# Patient Record
Sex: Male | Born: 1960 | Hispanic: No | Marital: Single | Smoking: Never smoker
Health system: Southern US, Community
[De-identification: ages and names within clinical notes are randomized; demographics above are authoritative.]

---

## 1997-04-13 ENCOUNTER — Ambulatory Visit (HOSPITAL_COMMUNITY): Admission: RE | Admit: 1997-04-13 | Discharge: 1997-04-13 | Payer: Self-pay | Admitting: *Deleted

## 2000-01-08 ENCOUNTER — Ambulatory Visit (HOSPITAL_COMMUNITY): Admission: RE | Admit: 2000-01-08 | Discharge: 2000-01-08 | Payer: Self-pay

## 2000-07-29 ENCOUNTER — Encounter: Payer: Self-pay | Admitting: Emergency Medicine

## 2000-07-29 ENCOUNTER — Emergency Department (HOSPITAL_COMMUNITY): Admission: EM | Admit: 2000-07-29 | Discharge: 2000-07-29 | Payer: Self-pay

## 2002-03-07 ENCOUNTER — Emergency Department (HOSPITAL_COMMUNITY): Admission: EM | Admit: 2002-03-07 | Discharge: 2002-03-08 | Payer: Self-pay | Admitting: Emergency Medicine

## 2002-03-08 ENCOUNTER — Encounter: Payer: Self-pay | Admitting: Emergency Medicine

## 2005-08-11 ENCOUNTER — Ambulatory Visit (HOSPITAL_COMMUNITY): Admission: RE | Admit: 2005-08-11 | Discharge: 2005-08-11 | Payer: Self-pay | Admitting: Emergency Medicine

## 2007-06-09 IMAGING — RF DG BE W/ CM - WO/W KUB
17 of 19 series · 17 of 19 positions shown · non-contrast
Comparison: none

CLINICAL DATA: Abdominal pain. Constipation. 
 BARIUM ENEMA:

[Series 1: run · 1 of 1 slices shown (1 of 15)]
[im 1/1]
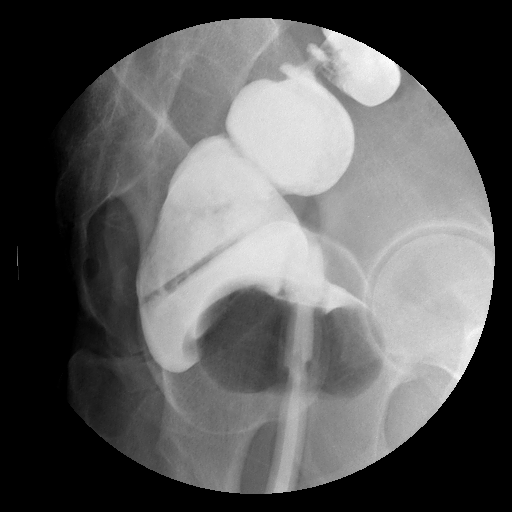

[Series 2: run · 1 of 1 slices shown (2 of 15)]
[im 1/1]
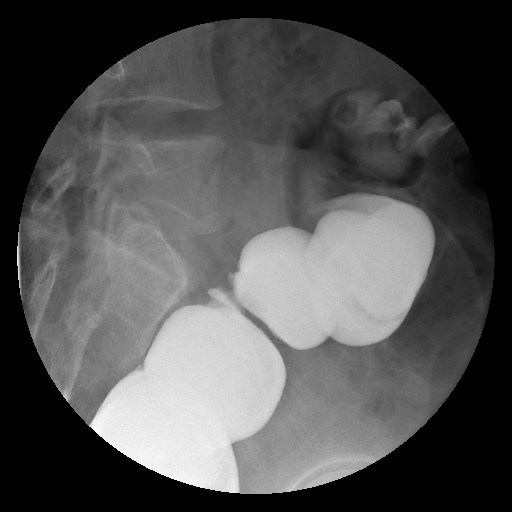

[Series 3: run · 1 of 1 slices shown (3 of 15)]
[im 1/1]
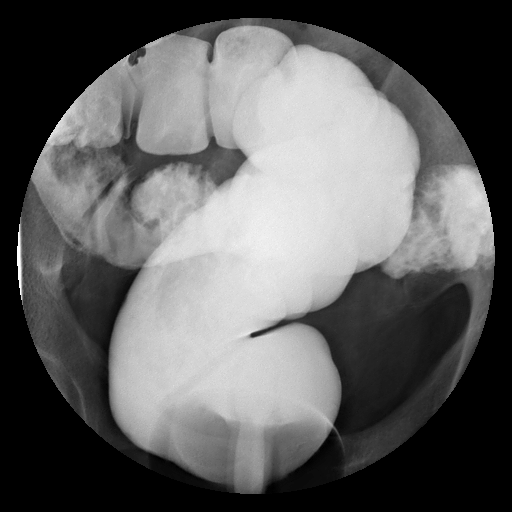

[Series 4: run · 1 of 1 slices shown (4 of 15)]
[im 1/1]
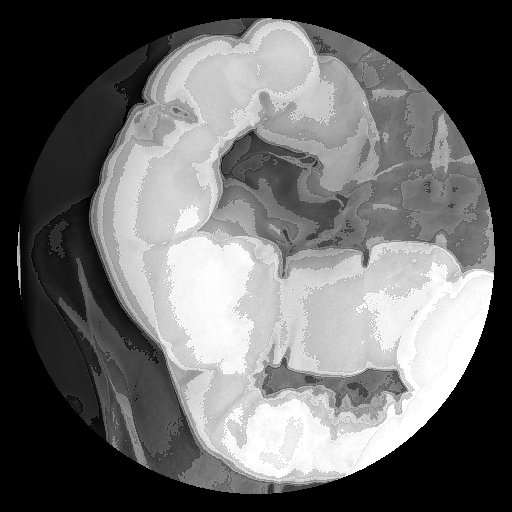

[Series 6: run · 1 of 1 slices shown (5 of 15)]
[im 1/1]
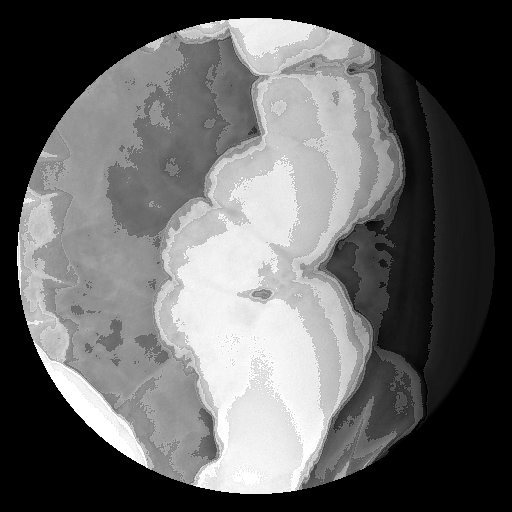

[Series 7: run · 1 of 1 slices shown (6 of 15)]
[im 1/1]
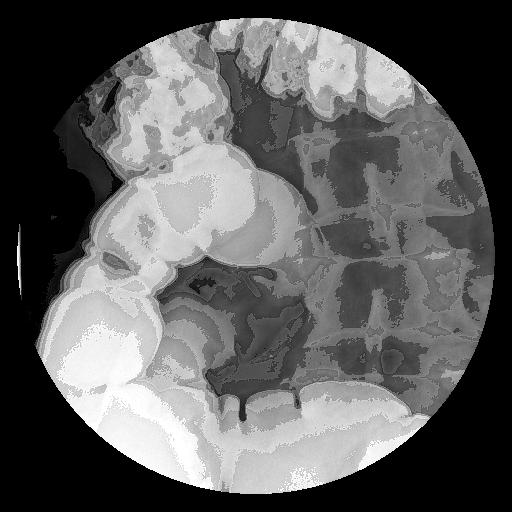

[Series 8: run · 1 of 1 slices shown (7 of 15)]
[im 1/1]
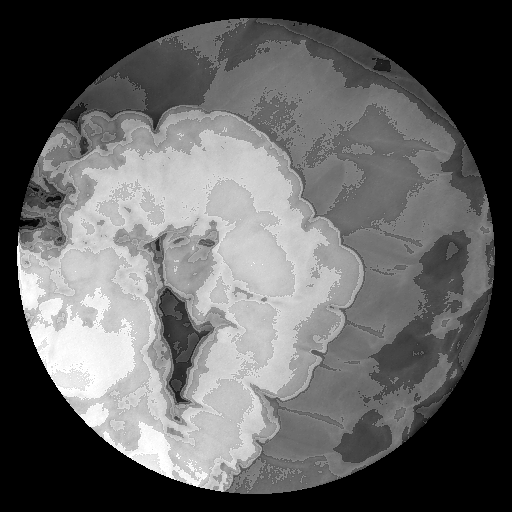

[Series 9: run · 1 of 1 slices shown (8 of 15)]
[im 1/1]
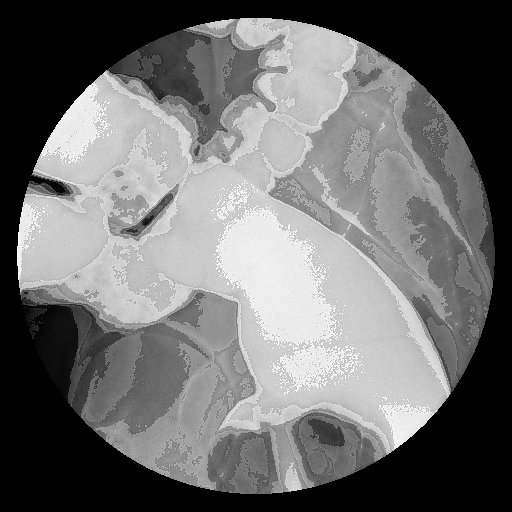

[Series 10: run · 1 of 1 slices shown (9 of 15)]
[im 1/1]
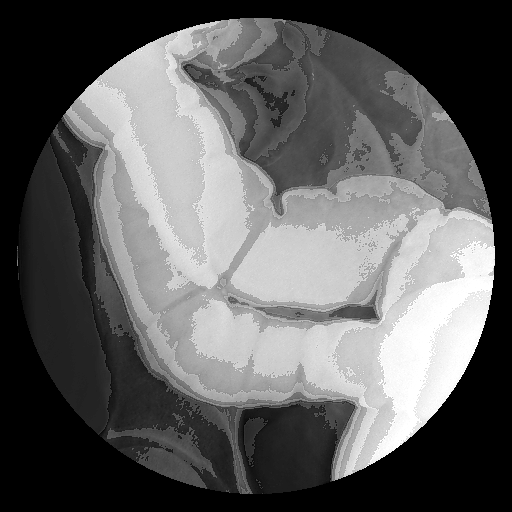

[Series 11: run · 1 of 1 slices shown (10 of 15)]
[im 1/1]
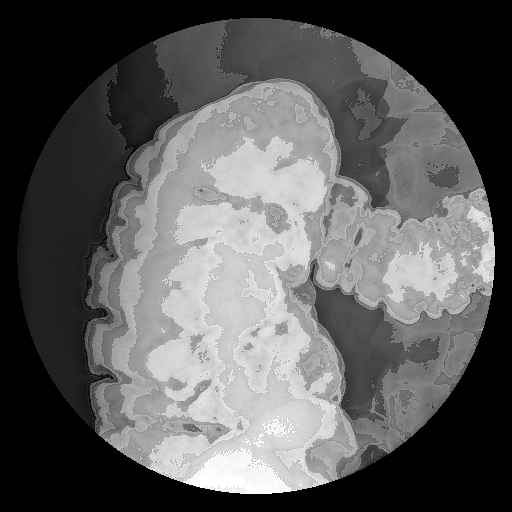

[Series 12: run · 1 of 1 slices shown (11 of 15)]
[im 1/1]
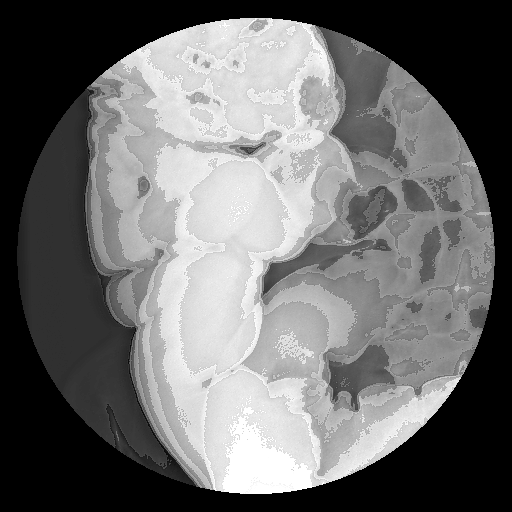

[Series 13: run · 1 of 1 slices shown (12 of 15)]
[im 1/1]
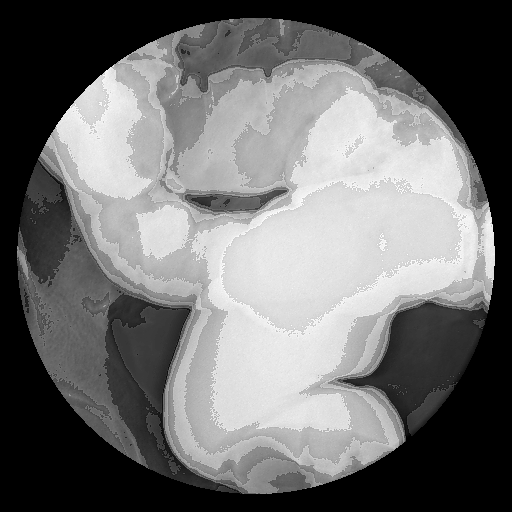

[Series 14: run · 1 of 1 slices shown (13 of 15)]
[im 1/1]
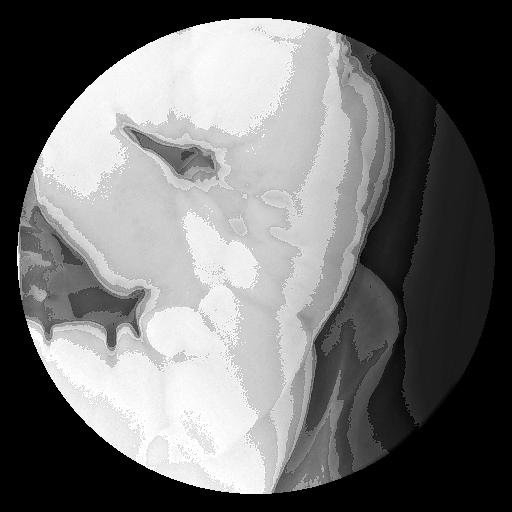

[Series 16: run · 1 of 1 slices shown (14 of 15)]
[im 1/1]
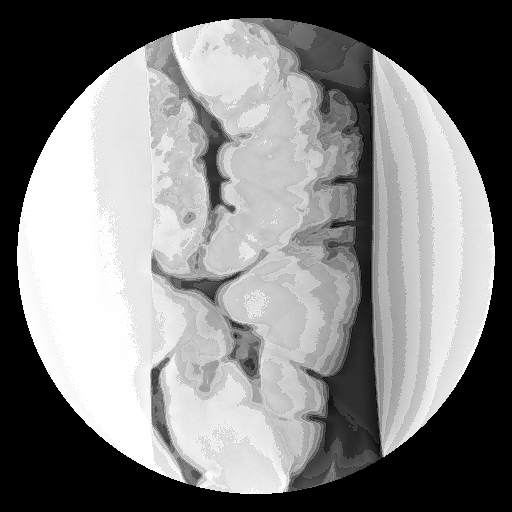

[Series 17: run · 1 of 1 slices shown (15 of 15)]
[im 1/1]
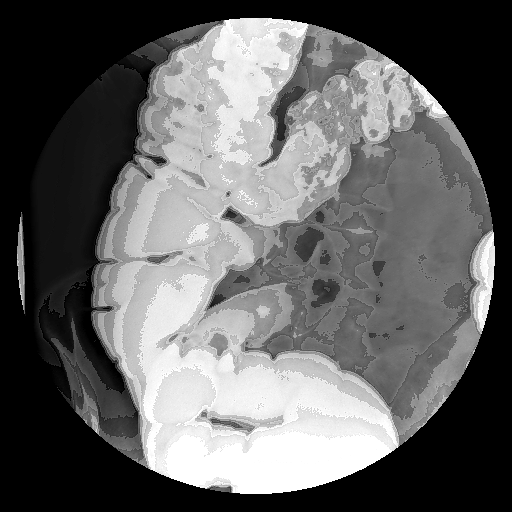

[Series 1001: view not recorded · 0.20mm/px · 1 of 1 slices shown (1 of 2)]
[im 1/1]
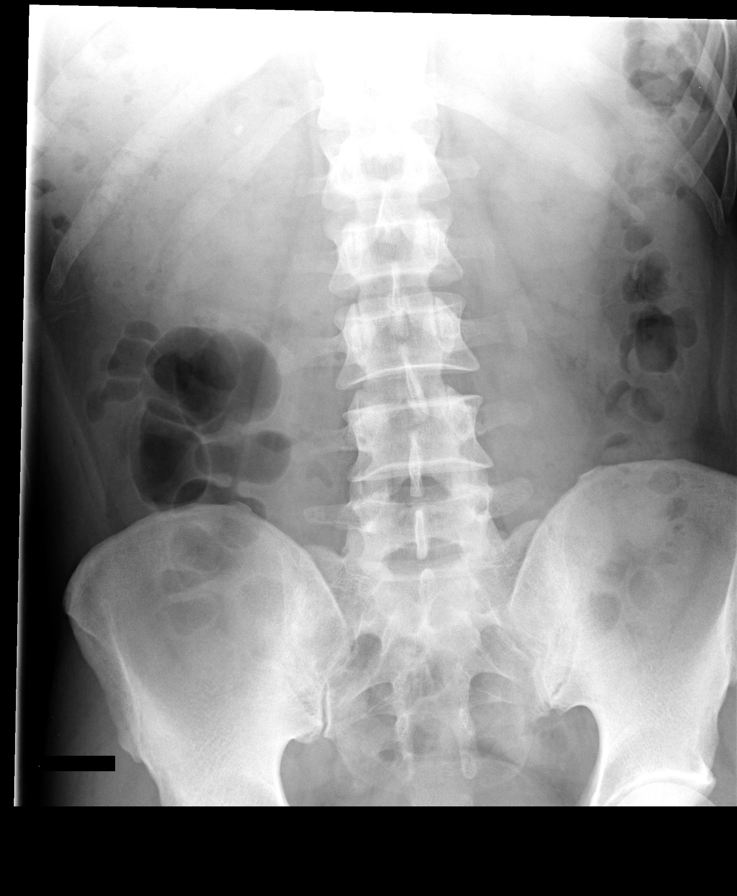

[Series 1002: view not recorded · 0.20mm/px · 1 of 1 slices shown (2 of 2)]
[im 1/1]
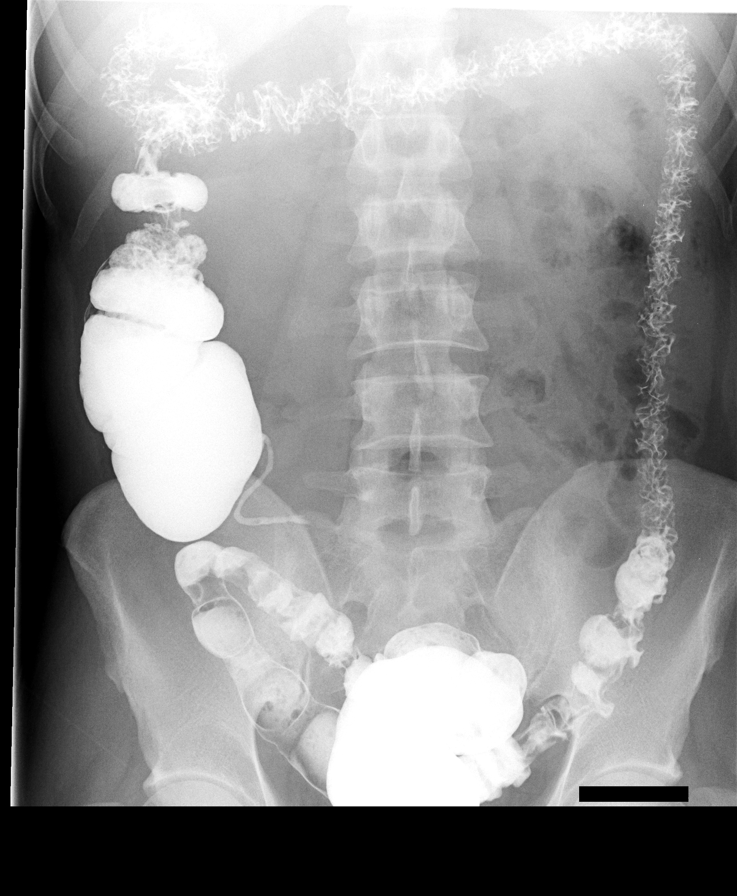

[17 of 19 positions shown; findings below may reference images not displayed]

FINDINGS: Preliminary scout film reveals no evidence of colonic distention. Particularly descending colon has a normal caliber. Approximately 5.5 x 6.8 mm calculus in the upper pole of the right kidney.  Barium was instilled in a retrograde fashion to opacify the entire colon.  The colon is somewhat tortuous and there is scattered stool, but no evidence of a colonic stricture or other obstructing lesion. This was an unprepped BE.
IMPRESSION: Specifically, the study is negative for colonic obstruction.

## 2011-05-12 ENCOUNTER — Ambulatory Visit: Payer: Self-pay | Admitting: Family Medicine

## 2011-05-12 VITALS — BP 126/74 | HR 79 | Temp 97.9°F | Resp 18 | Ht 69.0 in | Wt 163.0 lb

## 2011-05-12 DIAGNOSIS — I10 Essential (primary) hypertension: Secondary | ICD-10-CM

## 2011-05-12 LAB — COMPREHENSIVE METABOLIC PANEL
ALT: 19 U/L (ref 0–53)
AST: 20 U/L (ref 0–37)
Albumin: 4.3 g/dL (ref 3.5–5.2)
Alkaline Phosphatase: 62 U/L (ref 39–117)
BUN: 18 mg/dL (ref 6–23)
CO2: 25 mEq/L (ref 19–32)
Calcium: 9.2 mg/dL (ref 8.4–10.5)
Chloride: 107 mEq/L (ref 96–112)
Creat: 0.79 mg/dL (ref 0.50–1.35)
Glucose, Bld: 100 mg/dL — ABNORMAL HIGH (ref 70–99)
Potassium: 4 mEq/L (ref 3.5–5.3)
Sodium: 140 mEq/L (ref 135–145)
Total Bilirubin: 0.7 mg/dL (ref 0.3–1.2)
Total Protein: 6.6 g/dL (ref 6.0–8.3)

## 2011-05-12 MED ORDER — LISINOPRIL-HYDROCHLOROTHIAZIDE 10-12.5 MG PO TABS: 1.0000 | ORAL_TABLET | Freq: Every day | ORAL | Status: DC

## 2011-05-12 NOTE — Patient Instructions (Signed)
Your labs should return in the next 7 to 10 days.  Call us if you do not receive a call or letter with the results in the next 10 days.  Schedule physical in the next 6 months.

## 2011-05-12 NOTE — Progress Notes (Signed)
  Subjective:    Patient ID: Keith Blankenship, male    DOB: [birth year], 51 y.o.   MRN: [id number]  HPI [name] is a 51 y.o. male Taking BP med daily,  Has 4 left, no missed doses. No side effects with meds.  Lives in [place].  Last BMP 8/12 WNL except glucose 104.  Fasting this am.    Review of Systems  Constitutional: Negative for fatigue and unexpected weight change.  Eyes: Negative for visual disturbance.  Respiratory: Negative for cough, chest tightness and shortness of breath.   Cardiovascular: Negative for chest pain, palpitations and leg swelling.  Gastrointestinal: Negative for abdominal pain and blood in stool.  Neurological: Negative for dizziness, light-headedness and headaches.        Objective:   Physical Exam  Constitutional: He is oriented to person, place, and time. He appears well-developed and well-nourished.  HENT:  Head: Normocephalic and atraumatic.  Eyes: EOM are normal. Pupils are equal, round, and reactive to light.  Neck: No JVD present. Carotid bruit is not present.  Cardiovascular: Normal rate, regular rhythm and normal heart sounds.   No murmur heard. Pulmonary/Chest: Effort normal and breath sounds normal. He has no rales.  Musculoskeletal: He exhibits no edema.  Neurological: He is alert and oriented to person, place, and time.  Skin: Skin is warm and dry.  Psychiatric: He has a normal mood and affect.          Assessment & Plan:   1. HTN (hypertension) - controlled. Check Comprehensive metabolic panel, lipids, cont same doses meds.    Plan on CPE next 6 months - pt to call to schedule next 6 months.

## 2011-11-16 ENCOUNTER — Ambulatory Visit: Payer: Self-pay | Admitting: Emergency Medicine

## 2011-11-16 VITALS — BP 123/75 | HR 81 | Temp 98.5°F | Resp 18 | Ht 69.0 in | Wt 158.0 lb

## 2011-11-16 DIAGNOSIS — I10 Essential (primary) hypertension: Secondary | ICD-10-CM

## 2011-11-16 LAB — BASIC METABOLIC PANEL
BUN: 18 mg/dL (ref 6–23)
CO2: 30 mEq/L (ref 19–32)
Glucose, Bld: 104 mg/dL — ABNORMAL HIGH (ref 70–99)
Potassium: 4.4 mEq/L (ref 3.5–5.3)
Sodium: 140 mEq/L (ref 135–145)

## 2011-11-16 LAB — LIPID PANEL
Cholesterol: 164 mg/dL (ref 0–200)
Total CHOL/HDL Ratio: 4.6 Ratio
VLDL: 26 mg/dL (ref 0–40)

## 2011-11-16 MED ORDER — LISINOPRIL-HYDROCHLOROTHIAZIDE 10-12.5 MG PO TABS: 1.0000 | ORAL_TABLET | Freq: Every day | ORAL | Status: DC

## 2011-11-16 NOTE — Progress Notes (Signed)
  Subjective:    Patient ID: Keith Blankenship, male    DOB: 1961-01-23, 51 y.o.   MRN: [id number]  HPI here to followup on his high blood pressure. He has been taking his medication regular. He eats a healthy diet he. He is physically active with no other major medical problems.    Review of Systems     Objective:   Physical Exam chest is clear. Heart regular rate no murmurs. Abdomen soft nontender. Extremities without edema.        Assessment & Plan:  We'll check be met and lipid panel refilled medicine #90 with 3 refills

## 2011-11-16 NOTE — Patient Instructions (Addendum)

## 2012-06-13 ENCOUNTER — Ambulatory Visit: Payer: Self-pay | Admitting: Family Medicine

## 2012-06-13 VITALS — BP 124/64 | HR 91 | Temp 97.7°F | Resp 16 | Ht 69.0 in | Wt 166.0 lb

## 2012-06-13 DIAGNOSIS — I10 Essential (primary) hypertension: Secondary | ICD-10-CM | POA: Insufficient documentation

## 2012-06-13 DIAGNOSIS — Z79899 Other long term (current) drug therapy: Secondary | ICD-10-CM

## 2012-06-13 LAB — BASIC METABOLIC PANEL
Chloride: 100 mEq/L (ref 96–112)
Potassium: 4.2 mEq/L (ref 3.5–5.3)
Sodium: 135 mEq/L (ref 135–145)

## 2012-06-13 MED ORDER — LISINOPRIL-HYDROCHLOROTHIAZIDE 10-12.5 MG PO TABS: 1.0000 | ORAL_TABLET | Freq: Every day | ORAL | Status: DC

## 2012-06-13 NOTE — Progress Notes (Signed)
  Subjective:    Patient ID: Keith Blankenship, male    DOB: [birth year], 52 y.o.   MRN: [id number] Chief Complaint  Patient presents with  . Medication Refill    HPI   Mr. Seeley is doing well.  He has no complaints or concerns. He is in town visiting his brother so has come in for a refill on her BP medication.  His neighbor who works at the hospital has been checking his BP once a wk and it is always normal.  He is having no med side effects. No sig exercise but work on diet - only eats meat once a wk, lots of veggies, fish, olive oil  History reviewed. No pertinent past medical history. Current Outpatient Prescriptions on File Prior to Visit  Medication Sig Dispense Refill  . aspirin 81 MG tablet Take 81 mg by mouth as needed.       No current facility-administered medications on file prior to visit.   No Known Allergies   Review of Systems  Constitutional: Negative for fever and chills.  Eyes: Negative for visual disturbance.  Respiratory: Negative for shortness of breath.   Cardiovascular: Negative for chest pain and leg swelling.  Neurological: Negative for dizziness, syncope, facial asymmetry, weakness, light-headedness and headaches.      BP 124/64  Pulse 91  Temp(Src) 97.7 F (36.5 C) (Oral)  Resp 16  Ht 5\' 9"  (1.753 m)  Wt 166 lb (75.297 kg)  BMI 24.5 kg/m2  SpO2 98% Objective:   Physical Exam  Constitutional: He is oriented to person, place, and time. He appears well-developed and well-nourished. No distress.  HENT:  Head: Normocephalic and atraumatic.  Eyes: Conjunctivae are normal. Pupils are equal, round, and reactive to light. No scleral icterus.  Neck: Normal range of motion. Neck supple. No thyromegaly present.  Cardiovascular: Normal rate, regular rhythm, normal heart sounds and intact distal pulses.   Pulmonary/Chest: Effort normal and breath sounds normal. No respiratory distress.  Musculoskeletal: He exhibits no edema.  Lymphadenopathy:    He has no  cervical adenopathy.  Neurological: He is alert and oriented to person, place, and time.  Skin: Skin is warm and dry. He is not diaphoretic.  Psychiatric: He has a normal mood and affect. His behavior is normal.      Assessment & Plan:  Encounter for long-term (current) use of other medications - Plan: Basic metabolic panel  Essential hypertension, benign  Hypertension - Plan: lisinopril-hydrochlorothiazide (PRINZIDE,ZESTORETIC) 10-12.5 MG per tablet  Lipids checked last visit - at goal.  Recheck in 1 yr.

## 2013-06-24 ENCOUNTER — Telehealth: Payer: Self-pay

## 2013-06-24 NOTE — Telephone Encounter (Addendum)
PT STATES HE NOW LIVE IN [place] NOW AND IS OUT OF HIS LISINOPRIL 10-12.5MG S. WOULD LIKE TO HAVE ANOTHER 2 MONTHS SUPPLY CALLED IN FOR HIM PLEASE CALL PT AT [phone]    [facility] [place] AT [phone]

## 2013-06-24 NOTE — Telephone Encounter (Signed)
Advised pt that he would need to establish care in his area. He has not been seen in our office in over a year.

## 2017-10-28 LAB — LIPID PANEL
Cholesterol: 2 (ref 0–200)
HDL: 0 — AB (ref 35–70)
LDL Cholesterol: 1
Triglycerides: 3 — AB (ref 40–160)

## 2017-10-28 LAB — BASIC METABOLIC PANEL
Creatinine: 7.4 — AB (ref 0.6–1.3)
Glucose: 176

## 2018-01-14 ENCOUNTER — Encounter: Payer: Self-pay | Admitting: Family Medicine

## 2018-01-14 ENCOUNTER — Ambulatory Visit: Payer: Self-pay | Admitting: Family Medicine

## 2018-01-14 ENCOUNTER — Other Ambulatory Visit: Payer: Self-pay

## 2018-01-14 VITALS — BP 172/100 | HR 92 | Temp 97.9°F | Resp 16 | Ht 69.0 in | Wt 192.2 lb

## 2018-01-14 DIAGNOSIS — I10 Essential (primary) hypertension: Secondary | ICD-10-CM

## 2018-01-14 DIAGNOSIS — Z5181 Encounter for therapeutic drug level monitoring: Secondary | ICD-10-CM

## 2018-01-14 DIAGNOSIS — E781 Pure hyperglyceridemia: Secondary | ICD-10-CM

## 2018-01-14 MED ORDER — LISINOPRIL-HYDROCHLOROTHIAZIDE 10-12.5 MG PO TABS: 1.0000 | ORAL_TABLET | Freq: Every day | ORAL | 0 refills | Status: DC

## 2018-01-14 NOTE — Progress Notes (Signed)
Chief Complaint  Patient presents with  . Medication Refill    lisinopril-hctz 20mg /12.5 mg    HPI  Uncontrolled Hypertension and Hypertriglyceridemia Patient reports that he was seen in hypertension while in [place] in [place] He had lab done and his creatinine was in the normal range  He had low HDL and high triglycerides He states that he eats fish He is working on weight loss to correct his cholesterol and triglycerides No family history of heart attack or stroke He has never smoked cigarettes His last dose was 3 days ago   History reviewed. No pertinent past medical history.  Current Outpatient Medications  Medication Sig Dispense Refill  . aspirin 81 MG tablet Take 81 mg by mouth as needed.    Marland Kitchen lisinopril-hydrochlorothiazide (PRINZIDE,ZESTORETIC) 10-12.5 MG tablet Take 1 tablet by mouth daily. 90 tablet 0   No current facility-administered medications for this visit.     Allergies: No Known Allergies  History reviewed. No pertinent surgical history.  Social History   Socioeconomic History  . Marital status: Single    Spouse name: Not on file  . Number of children: Not on file  . Years of education: Not on file  . Highest education level: Not on file  Occupational History  . Occupation: Environmental manager  Social Needs  . Financial resource strain: Not on file  . Food insecurity:    Worry: Not on file    Inability: Not on file  . Transportation needs:    Medical: Not on file    Non-medical: Not on file  Tobacco Use  . Smoking status: Never Smoker  . Smokeless tobacco: Never Used  Substance and Sexual Activity  . Alcohol use: No  . Drug use: No  . Sexual activity: Not on file  Lifestyle  . Physical activity:    Days per week: Not on file    Minutes per session: Not on file  . Stress: Not on file  Relationships  . Social connections:    Talks on phone: Not on file    Gets together: Not on file    Attends religious service: Not on file    Active  member of club or organization: Not on file    Attends meetings of clubs or organizations: Not on file    Relationship status: Not on file  Other Topics Concern  . Not on file  Social History Narrative  . Not on file    History reviewed. No pertinent family history.   ROS Review of Systems See HPI Constitution: No fevers or chills No malaise No diaphoresis Skin: No rash or itching Eyes: no blurry vision, no double vision GU: no dysuria or hematuria Neuro: no dizziness or headaches * all others reviewed and negative   Objective: Vitals:   01/14/18 0829  BP: (!) 172/100  Pulse: 92  Resp: 16  Temp: 97.9 F (36.6 C)  TempSrc: Oral  SpO2: 93%  Weight: 192 lb 3.2 oz (87.2 kg)  Height: 5\' 9"  (1.753 m)    Physical Exam  Constitutional: He is oriented to person, place, and time. He appears well-developed and well-nourished.  HENT:  Head: Normocephalic and atraumatic.  Eyes: Pupils are equal, round, and reactive to light. Conjunctivae and EOM are normal.  Normal fundoscopic exam   Neck: Normal range of motion. Neck supple.  Cardiovascular: Normal rate, regular rhythm and normal heart sounds.  No murmur heard. Pulmonary/Chest: Effort normal and breath sounds normal. No stridor. No respiratory distress. He has  no wheezes.  Neurological: He is alert and oriented to person, place, and time.  Skin: Skin is warm. Capillary refill takes less than 2 seconds.  Psychiatric: He has a normal mood and affect. His behavior is normal. Judgment and thought content normal.     Assessment and Plan Geddy was seen today for medication refill.  Diagnoses and all orders for this visit:  Hypertriglyceridemia- discussed fish oil and ways to improve HDL -     Basic Metabolic Panel -     Lipid panel  Encounter for medication monitoring- will check renal  -     Basic Metabolic Panel -     Lipid panel  Uncontrolled hypertension- discussed goals for bp Pt to return in 6 weeks after  resuming meds If his bp is normal at that time will give a year's refills since he is a traveler -     lisinopril-hydrochlorothiazide (PRINZIDE,ZESTORETIC) 10-12.5 MG tablet; Take 1 tablet by mouth daily.     Keith Blankenship A Zakya Halabi

## 2018-01-14 NOTE — Patient Instructions (Addendum)
   If you have lab work done today you will be contacted with your lab results within the next 2 weeks.  If you have not heard from us then please contact us. The fastest way to get your results is to register for My Chart.   IF you received an x-ray today, you will receive an invoice from [facility]. Please contact [facility] at [phone] with questions or concerns regarding your invoice.   IF you received labwork today, you will receive an invoice from [facility]. Please contact [facility] at [phone] with questions or concerns regarding your invoice.   Our billing staff will not be able to assist you with questions regarding bills from these companies.  You will be contacted with the lab results as soon as they are available. The fastest way to get your results is to activate your My Chart account. Instructions are located on the last page of this paperwork. If you have not heard from us regarding the results in 2 weeks, please contact this office.     Food Choices to Lower Your Triglycerides Triglycerides are a type of fat in your blood. High levels of triglycerides can increase the risk of heart disease and stroke. If your triglyceride levels are high, the foods you eat and your eating habits are very important. Choosing the right foods can help lower your triglycerides. What general guidelines do I need to follow?  Lose weight if you are overweight.  Limit or avoid alcohol.  Fill one half of your plate with vegetables and green salads.  Limit fruit to two servings a day. Choose fruit instead of juice.  Make one fourth of your plate whole grains. Look for the word "whole" as the first word in the ingredient list.  Fill one fourth of your plate with lean protein foods.  Enjoy fatty fish (such as salmon, mackerel, sardines, and tuna) three times a week.  Choose healthy fats.  Limit foods high in starch and sugar.  Eat more home-cooked food and less  restaurant, buffet, and fast food.  Limit fried foods.  Cook foods using methods other than frying.  Limit saturated fats.  Check ingredient lists to avoid foods with partially hydrogenated oils (trans fats) in them. What foods can I eat? Grains Whole grains, such as whole wheat or whole grain breads, crackers, cereals, and pasta. Unsweetened oatmeal, bulgur, barley, quinoa, or brown rice. Corn or whole wheat flour tortillas. Vegetables Fresh or frozen vegetables (raw, steamed, roasted, or grilled). Green salads. Fruits All fresh, canned (in natural juice), or frozen fruits. Meat and Other Protein Products Ground beef (85% or leaner), grass-fed beef, or beef trimmed of fat. Skinless chicken or turkey. Ground chicken or turkey. Pork trimmed of fat. All fish and seafood. Eggs. Dried beans, peas, or lentils. Unsalted nuts or seeds. Unsalted canned or dry beans. Dairy Low-fat dairy products, such as skim or 1% milk, 2% or reduced-fat cheeses, low-fat ricotta or cottage cheese, or plain low-fat yogurt. Fats and Oils Tub margarines without trans fats. Light or reduced-fat mayonnaise and salad dressings. Avocado. Safflower, olive, or canola oils. Natural peanut or almond butter. The items listed above may not be a complete list of recommended foods or beverages. Contact your dietitian for more options. What foods are not recommended? Grains White bread. White pasta. White rice. Cornbread. Bagels, pastries, and croissants. Crackers that contain trans fat. Vegetables White potatoes. Corn. Creamed or fried vegetables. Vegetables in a cheese sauce. Fruits Dried fruits. Canned fruit in   light or heavy syrup. Fruit juice. Meat and Other Protein Products Fatty cuts of meat. Ribs, chicken wings, bacon, sausage, bologna, salami, chitterlings, fatback, hot dogs, bratwurst, and packaged luncheon meats. Dairy Whole or 2% milk, cream, half-and-half, and cream cheese. Whole-fat or sweetened yogurt.  Full-fat cheeses. Nondairy creamers and whipped toppings. Processed cheese, cheese spreads, or cheese curds. Sweets and Desserts Corn syrup, sugars, honey, and molasses. Candy. Jam and jelly. Syrup. Sweetened cereals. Cookies, pies, cakes, donuts, muffins, and ice cream. Fats and Oils Butter, stick margarine, lard, shortening, ghee, or bacon fat. Coconut, palm kernel, or palm oils. Beverages Alcohol. Sweetened drinks (such as sodas, lemonade, and fruit drinks or punches). The items listed above may not be a complete list of foods and beverages to avoid. Contact your dietitian for more information. This information is not intended to replace advice given to you by your health care provider. Make sure you discuss any questions you have with your health care provider. Document Released: 12/16/2003 Document Revised: 08/05/2015 Document Reviewed: 01/01/2013 Elsevier Interactive Patient Education  2017 Elsevier Inc.  

## 2018-01-15 ENCOUNTER — Encounter: Payer: Self-pay | Admitting: Family Medicine

## 2018-01-15 LAB — BASIC METABOLIC PANEL
BUN/Creatinine Ratio: 18 (ref 9–20)
BUN: 17 mg/dL (ref 6–24)
CO2: 21 mmol/L (ref 20–29)
Calcium: 9.4 mg/dL (ref 8.7–10.2)
Chloride: 108 mmol/L — ABNORMAL HIGH (ref 96–106)
Creatinine, Ser: 0.95 mg/dL (ref 0.76–1.27)
GFR, EST AFRICAN AMERICAN: 103 mL/min/{1.73_m2} (ref >59)
GFR, EST NON AFRICAN AMERICAN: 89 mL/min/{1.73_m2} (ref >59)
Glucose: 107 mg/dL — ABNORMAL HIGH (ref 65–99)
Potassium: 4.4 mmol/L (ref 3.5–5.2)
SODIUM: 143 mmol/L (ref 134–144)

## 2018-01-15 LAB — LIPID PANEL
CHOL/HDL RATIO: 5 ratio (ref 0.0–5.0)
Cholesterol, Total: 186 mg/dL (ref 100–199)
HDL: 37 mg/dL — ABNORMAL LOW (ref >39)
LDL Calculated: 124 mg/dL — ABNORMAL HIGH (ref 0–99)
TRIGLYCERIDES: 126 mg/dL (ref 0–149)
VLDL Cholesterol Cal: 25 mg/dL (ref 5–40)

## 2018-02-25 ENCOUNTER — Ambulatory Visit: Payer: Self-pay | Admitting: Family Medicine

## 2018-02-25 ENCOUNTER — Encounter: Payer: Self-pay | Admitting: Family Medicine

## 2018-02-25 ENCOUNTER — Other Ambulatory Visit: Payer: Self-pay

## 2018-02-25 VITALS — BP 130/87 | HR 103 | Temp 97.3°F | Resp 18 | Ht 69.0 in | Wt 179.6 lb

## 2018-02-25 DIAGNOSIS — I10 Essential (primary) hypertension: Secondary | ICD-10-CM

## 2018-02-25 DIAGNOSIS — Z760 Encounter for issue of repeat prescription: Secondary | ICD-10-CM

## 2018-02-25 MED ORDER — LISINOPRIL-HYDROCHLOROTHIAZIDE 10-12.5 MG PO TABS: 1.0000 | ORAL_TABLET | Freq: Every day | ORAL | 3 refills | Status: DC

## 2018-02-25 NOTE — Progress Notes (Signed)
Established Patient Office Visit  Subjective:  Patient ID: Keith Blankenship, male    DOB: [birth year]  Age: 57 y.o. MRN: [id number]  CC:  Chief Complaint  Patient presents with  . Hypertension    follow up   . Insomnia    HPI Brando Taves presents for   Insomnia Reports that he goes to bed 10pm and wakes up at 6am He reports that he wakes up multiple times at night which makes him tired when he wakes up in the morning He reports that he falls asleep He denies agitation or restlessness He states that he works 10 hour shifts in the day time No history of snoring or daytime fatigue.   Hypertension: Patient here for follow-up of elevated blood pressure. He is exercising and is adherent to low salt diet.  Blood pressure is well controlled at home. Cardiac symptoms none. Patient denies chest pain, chest pressure/discomfort, claudication, dyspnea, exertional chest pressure/discomfort, fatigue, irregular heart beat, lower extremity edema and near-syncope.  Cardiovascular risk factors: hypertension. Use of agents associated with hypertension: none. History of target organ damage: none. BP Readings from Last 3 Encounters:  02/25/18 130/87  01/14/18 (!) 172/100  06/13/12 124/64    He reports that his weight at  Home is 173 lbs or 174 lbs He states that last time when he was weighed he had his jacket and clothes on. He reports that he has gained a few pounds but not much  Wt Readings from Last 3 Encounters:  02/25/18 179 lb 9.6 oz (81.5 kg)  01/14/18 192 lb 3.2 oz (87.2 kg)  06/13/12 166 lb (75.3 kg)     No past medical history on file.  No past surgical history on file.  No family history on file.  Social History   Socioeconomic History  . Marital status: Single    Spouse name: Not on file  . Number of children: Not on file  . Years of education: Not on file  . Highest education level: Not on file  Occupational History  . Occupation: Environmental manager  Social Needs  . Financial  resource strain: Not on file  . Food insecurity:    Worry: Not on file    Inability: Not on file  . Transportation needs:    Medical: Not on file    Non-medical: Not on file  Tobacco Use  . Smoking status: Never Smoker  . Smokeless tobacco: Never Used  Substance and Sexual Activity  . Alcohol use: No  . Drug use: No  . Sexual activity: Not on file  Lifestyle  . Physical activity:    Days per week: Not on file    Minutes per session: Not on file  . Stress: Not on file  Relationships  . Social connections:    Talks on phone: Not on file    Gets together: Not on file    Attends religious service: Not on file    Active member of club or organization: Not on file    Attends meetings of clubs or organizations: Not on file    Relationship status: Not on file  . Intimate partner violence:    Fear of current or ex partner: Not on file    Emotionally abused: Not on file    Physically abused: Not on file    Forced sexual activity: Not on file  Other Topics Concern  . Not on file  Social History Narrative  . Not on file    Outpatient Medications Prior to  Visit  Medication Sig Dispense Refill  . lisinopril-hydrochlorothiazide (PRINZIDE,ZESTORETIC) 10-12.5 MG tablet Take 1 tablet by mouth daily. 90 tablet 0  . aspirin 81 MG tablet Take 81 mg by mouth as needed.     No facility-administered medications prior to visit.     No Known Allergies  ROS Review of Systems    Objective:    Physical Exam  BP 130/87   Pulse (!) 103   Temp (!) 97.3 F (36.3 C) (Oral)   Resp 18   Ht 5\' 9"  (1.753 m)   Wt 179 lb 9.6 oz (81.5 kg)   SpO2 96%   BMI 26.52 kg/m  Wt Readings from Last 3 Encounters:  02/25/18 179 lb 9.6 oz (81.5 kg)  01/14/18 192 lb 3.2 oz (87.2 kg)  06/13/12 166 lb (75.3 kg)   Physical Exam  Constitutional: Oriented to person, place, and time. Appears well-developed and well-nourished.  HENT:  Head: Normocephalic and atraumatic.  Eyes: Conjunctivae and EOM are  normal.  Cardiovascular: Normal rate, regular rhythm, normal heart sounds and intact distal pulses.  No murmur heard. Pulmonary/Chest: Effort normal and breath sounds normal. No stridor. No respiratory distress. Has no wheezes.  Neurological: Is alert and oriented to person, place, and time.  Skin: Skin is warm. Capillary refill takes less than 2 seconds.  Psychiatric: Has a normal mood and affect. Behavior is normal. Judgment and thought content normal.   Health Maintenance Due  Topic Date Due  . Hepatitis C Screening  31-Jul-1960  . HIV Screening  12/07/1976  . TETANUS/TDAP  12/07/1980  . COLONOSCOPY  12/08/2011    There are no preventive care reminders to display for this patient.  No results found for: TSH No results found for: WBC, HGB, HCT, MCV, PLT Lab Results  Component Value Date   NA 143 01/14/2018   K 4.4 01/14/2018   CO2 21 01/14/2018   GLUCOSE 107 (H) 01/14/2018   BUN 17 01/14/2018   CREATININE 0.95 01/14/2018   BILITOT 0.7 05/12/2011   ALKPHOS 62 05/12/2011   AST 20 05/12/2011   ALT 19 05/12/2011   PROT 6.6 05/12/2011   ALBUMIN 4.3 05/12/2011   CALCIUM 9.4 01/14/2018   Lab Results  Component Value Date   CHOL 186 01/14/2018   Lab Results  Component Value Date   HDL 37 (L) 01/14/2018   Lab Results  Component Value Date   LDLCALC 124 (H) 01/14/2018   Lab Results  Component Value Date   TRIG 126 01/14/2018   Lab Results  Component Value Date   CHOLHDL 5.0 01/14/2018   No results found for: HGBA1C    Assessment & Plan:   Problem List Items Addressed This Visit      Cardiovascular and Mediastinum   Essential hypertension, benign - Primary   Relevant Medications   lisinopril-hydrochlorothiazide (PRINZIDE,ZESTORETIC) 10-12.5 MG tablet    Other Visit Diagnoses    Encounter for medication refill       Relevant Medications   lisinopril-hydrochlorothiazide (PRINZIDE,ZESTORETIC) 10-12.5 MG tablet      Meds ordered this encounter    Medications  . lisinopril-hydrochlorothiazide (PRINZIDE,ZESTORETIC) 10-12.5 MG tablet    Sig: Take 1 tablet by mouth daily.    Dispense:  90 tablet    Refill:  3    Follow-up: No follow-ups on file.    Doristine BosworthZoe A Sheena Simonis, MD

## 2018-02-25 NOTE — Patient Instructions (Addendum)
Return to clinic in 6 months for physical exam and lab testing for prostate cancer, cholesterol and diabetes   If you have lab work done today you will be contacted with your lab results within the next 2 weeks.  If you have not heard from us then please contact us. The fastest way to get your results is to register for My Chart.   IF you received an x-ray today, you will receive an invoice from [facility]. Please contact [facility] at [phone] with questions or concerns regarding your invoice.   IF you received labwork today, you will receive an invoice from [facility]. Please contact [facility] at [phone] with questions or concerns regarding your invoice.   Our billing staff will not be able to assist you with questions regarding bills from these companies.  You will be contacted with the lab results as soon as they are available. The fastest way to get your results is to activate your My Chart account. Instructions are located on the last page of this paperwork. If you have not heard from us regarding the results in 2 weeks, please contact this office.

## 2018-02-27 ENCOUNTER — Other Ambulatory Visit: Payer: Self-pay

## 2018-08-26 ENCOUNTER — Encounter: Payer: Self-pay | Admitting: Family Medicine

## 2018-08-27 ENCOUNTER — Encounter: Payer: Self-pay | Admitting: Family Medicine

## 2019-05-06 ENCOUNTER — Ambulatory Visit (INDEPENDENT_AMBULATORY_CARE_PROVIDER_SITE_OTHER): Payer: Self-pay | Admitting: Family Medicine

## 2019-05-06 ENCOUNTER — Other Ambulatory Visit: Payer: Self-pay

## 2019-05-06 VITALS — BP 142/83 | HR 58 | Temp 98.3°F | Resp 15 | Ht 69.0 in | Wt 170.0 lb

## 2019-05-06 DIAGNOSIS — I10 Essential (primary) hypertension: Secondary | ICD-10-CM

## 2019-05-06 DIAGNOSIS — E781 Pure hyperglyceridemia: Secondary | ICD-10-CM

## 2019-05-06 DIAGNOSIS — Z760 Encounter for issue of repeat prescription: Secondary | ICD-10-CM

## 2019-05-06 DIAGNOSIS — Z833 Family history of diabetes mellitus: Secondary | ICD-10-CM

## 2019-05-06 MED ORDER — LISINOPRIL-HYDROCHLOROTHIAZIDE 10-12.5 MG PO TABS: 1.0000 | ORAL_TABLET | Freq: Every day | ORAL | 3 refills | Status: AC

## 2019-05-06 MED ORDER — LISINOPRIL-HYDROCHLOROTHIAZIDE 10-12.5 MG PO TABS: 1.0000 | ORAL_TABLET | Freq: Every day | ORAL | 3 refills | Status: DC

## 2019-05-06 NOTE — Progress Notes (Signed)
Established Patient Office Visit  Subjective:  Patient ID: Keith Blankenship, male    DOB: [birth year]  Age: 59 y.o. MRN: [id number]  CC:  Chief Complaint  Patient presents with  . Medication Refill    lisinopril/HCTZ needs refill, pt reports no side effects, feels it has been effective in treating his HTN, pt would like information onCOVID-19 vaccine    HPI Lum Stillinger presents for   Hypertension: Patient here for follow-up of elevated blood pressure. He is exercising and is adherent to low salt diet.  Blood pressure is well controlled at home. Cardiac symptoms none. Patient denies chest pain, claudication, exertional chest pressure/discomfort, irregular heart beat and lower extremity edema.  Cardiovascular risk factors: advanced age (older than 44 for men, 8 for women), hypertension and male gender. Use of agents associated with hypertension: none. History of target organ damage: none. BP Readings from Last 3 Encounters:  05/06/19 (!) 142/83  02/25/18 130/87  01/14/18 (!) 172/100   He plans to get his colonoscopy and health screening back home in [place] He plans to go back to [place].   No past medical history on file.  No past surgical history on file.  No family history on file.  Social History   Socioeconomic History  . Marital status: Single    Spouse name: Not on file  . Number of children: Not on file  . Years of education: Not on file  . Highest education level: Not on file  Occupational History  . Occupation: Environmental manager  Tobacco Use  . Smoking status: Never Smoker  . Smokeless tobacco: Never Used  Substance and Sexual Activity  . Alcohol use: No  . Drug use: No  . Sexual activity: Not on file  Other Topics Concern  . Not on file  Social History Narrative  . Not on file   Social Determinants of Health   Financial Resource Strain:   . Difficulty of Paying Living Expenses: Not on file  Food Insecurity:   . Worried About Programme researcher, broadcasting/film/video in the Last  Year: Not on file  . Ran Out of Food in the Last Year: Not on file  Transportation Needs:   . Lack of Transportation (Medical): Not on file  . Lack of Transportation (Non-Medical): Not on file  Physical Activity:   . Days of Exercise per Week: Not on file  . Minutes of Exercise per Session: Not on file  Stress:   . Feeling of Stress : Not on file  Social Connections:   . Frequency of Communication with Friends and Family: Not on file  . Frequency of Social Gatherings with Friends and Family: Not on file  . Attends Religious Services: Not on file  . Active Member of Clubs or Organizations: Not on file  . Attends Banker Meetings: Not on file  . Marital Status: Not on file  Intimate Partner Violence:   . Fear of Current or Ex-Partner: Not on file  . Emotionally Abused: Not on file  . Physically Abused: Not on file  . Sexually Abused: Not on file    Outpatient Medications Prior to Visit  Medication Sig Dispense Refill  . lisinopril-hydrochlorothiazide (PRINZIDE,ZESTORETIC) 10-12.5 MG tablet Take 1 tablet by mouth daily. 90 tablet 3  . aspirin 81 MG tablet Take 81 mg by mouth as needed.     No facility-administered medications prior to visit.    No Known Allergies  ROS Review of Systems Review of Systems  Constitutional: Negative for  activity change, appetite change, chills and fever.  HENT: Negative for congestion, nosebleeds, trouble swallowing and voice change.   Respiratory: Negative for cough, shortness of breath and wheezing.   Gastrointestinal: Negative for diarrhea, nausea and vomiting.  Genitourinary: Negative for difficulty urinating, dysuria, flank pain and hematuria.  Musculoskeletal: Negative for back pain, joint swelling and neck pain.  Neurological: Negative for dizziness, speech difficulty, light-headedness and numbness.  See HPI. All other review of systems negative.     Objective:     BP (!) 142/83   Pulse (!) 58   Temp 98.3 F (36.8 C)  (Temporal)   Resp 15   Ht 5\' 9"  (1.753 m)   Wt 170 lb (77.1 kg)   SpO2 94%   BMI 25.10 kg/m  Wt Readings from Last 3 Encounters:  05/06/19 170 lb (77.1 kg)  02/25/18 179 lb 9.6 oz (81.5 kg)  01/14/18 192 lb 3.2 oz (87.2 kg)    Physical Exam  Constitutional: He is oriented to person, place, and time. He appears well-developed and well-nourished.  HENT:  Head: Normocephalic and atraumatic.  Eyes: Conjunctivae and EOM are normal.  Cardiovascular: Normal rate, regular rhythm and normal heart sounds.  Pulmonary/Chest: Effort normal and breath sounds normal. No respiratory distress. He has no wheezes.  Abdominal: Soft. Bowel sounds are normal. He exhibits no distension and no mass. There is no abdominal tenderness. There is no rebound and no guarding.  Neurological: He is alert and oriented to person, place, and time. He has normal reflexes.  Psychiatric: He has a normal mood and affect. His behavior is normal. Judgment and thought content normal.      Health Maintenance Due  Topic Date Due  . HIV Screening  12/07/1976    There are no preventive care reminders to display for this patient.  No results found for: TSH No results found for: WBC, HGB, HCT, MCV, PLT Lab Results  Component Value Date   NA 143 01/14/2018   K 4.4 01/14/2018   CO2 21 01/14/2018   GLUCOSE 107 (H) 01/14/2018   BUN 17 01/14/2018   CREATININE 0.95 01/14/2018   BILITOT 0.7 05/12/2011   ALKPHOS 62 05/12/2011   AST 20 05/12/2011   ALT 19 05/12/2011   PROT 6.6 05/12/2011   ALBUMIN 4.3 05/12/2011   CALCIUM 9.4 01/14/2018   Lab Results  Component Value Date   CHOL 186 01/14/2018   Lab Results  Component Value Date   HDL 37 (L) 01/14/2018   Lab Results  Component Value Date   LDLCALC 124 (H) 01/14/2018   Lab Results  Component Value Date   TRIG 126 01/14/2018   Lab Results  Component Value Date   CHOLHDL 5.0 01/14/2018   No results found for: HGBA1C    Assessment & Plan:   Problem  List Items Addressed This Visit      Cardiovascular and Mediastinum   Essential hypertension, benign  - Patient's blood pressure is at goal of 139/89 or less. Condition is stable. Continue current medications and treatment plan. I recommend that you exercise for 30-45 minutes 5 days a week. I also recommend a balanced diet with fruits and vegetables every day, lean meats, and little fried foods. The DASH diet (you can find this online) is a good example of this.   Relevant Orders   Lipid panel   Comprehensive metabolic panel   Hemoglobin A1c    Other Visit Diagnoses    Hypertriglyceridemia    -  Primary Discussed  his levels and will screen for dyslipidemia   Relevant Orders   Lipid panel   Comprehensive metabolic panel   Hemoglobin A1c   Family history of diabetes mellitus in mother    -  Will screen for diabetes   Relevant Orders   Hemoglobin A1c      No orders of the defined types were placed in this encounter.   Follow-up: No follow-ups on file.    Doristine Bosworth, MD

## 2019-05-06 NOTE — Patient Instructions (Addendum)
   If you have lab work done today you will be contacted with your lab results within the next 2 weeks.  If you have not heard from us then please contact us. The fastest way to get your results is to register for My Chart.   IF you received an x-ray today, you will receive an invoice from [facility]. Please contact [facility] at [phone] with questions or concerns regarding your invoice.   IF you received labwork today, you will receive an invoice from [facility]. Please contact [facility] at [phone] with questions or concerns regarding your invoice.   Our billing staff will not be able to assist you with questions regarding bills from these companies.  You will be contacted with the lab results as soon as they are available. The fastest way to get your results is to activate your My Chart account. Instructions are located on the last page of this paperwork. If you have not heard from us regarding the results in 2 weeks, please contact this office.     Colorectal Cancer Screening  Colorectal cancer screening is a group of tests that are used to check for colorectal cancer before symptoms develop. Colorectal refers to the colon and rectum. The colon and rectum are located at the end of the digestive tract and carry bowel movements out of the body. Who should have screening? All adults starting at age 50 until age 75 should have screening. Your health care provider may recommend screening at age 45. You will have tests every 1-10 years, depending on your results and the type of screening test. You may have screening tests starting at an earlier age, or more frequently than other people, if you have any of the following risk factors:  A personal or family history of colorectal cancer or abnormal growths (polyps).  Inflammatory bowel disease, such as ulcerative colitis or Crohn's disease.  A history of having radiation treatment to the abdomen or pelvic area for  cancer.  Colorectal cancer symptoms, such as changes in bowel habits or blood in your stool.  A type of colon cancer syndrome that is passed from parent to child (hereditary), such as: ? Lynch syndrome. ? Familial adenomatous polyposis. ? Turcot syndrome. ? Peutz-Jeghers syndrome. Screening recommendations for adults who are 75-85 years old vary depending on health. How is screening done? There are several types of colorectal screening tests. You may have one or more of the following:  Guaiac-based fecal occult blood testing. For this test, a stool (feces) sample is checked for hidden (occult) blood, which could be a sign of colorectal cancer.  Fecal immunochemical test (FIT). For this test, a stool sample is checked for blood, which could be a sign of colorectal cancer.  Stool DNA test. For this test, a stool sample is checked for blood and changes in DNA that could lead to colorectal cancer.  Sigmoidoscopy. During this test, a thin, flexible tube with a camera on the end (sigmoidoscope) is used to examine the rectum and the lower colon.  Colonoscopy. During this test, a long, flexible tube with a camera on the end (colonoscope) is used to examine the entire colon and rectum. With a colonoscopy, it is possible to take a sample of tissue (biopsy) and remove small polyps during the test.  Virtual colonoscopy. Instead of a colonoscope, this type of colonoscopy uses X-rays (CT scan) and computers to produce images of the colon and rectum. What are the benefits of screening? Screening reduces your risk   for colorectal cancer and can help identify cancer at an early stage, when the cancer can be removed or treated more easily. It is common for polyps to form in the lining of the colon, especially as you age. These polyps may be cancerous or become cancerous over time. Screening can identify these polyps. What are the risks of screening? Each screening test may have different risks.  Stool  sample tests have fewer risks than other types of screening tests. However, you may need more tests to confirm results from a stool sample test.  Screening tests that involve X-rays expose you to low levels of radiation, which may slightly increase your cancer risk. The benefit of detecting cancer outweighs the slight increase in risk.  Screening tests such as sigmoidoscopy and colonoscopy may place you at risk for bleeding, intestinal damage, infection, or a reaction to medicines given during the exam. Talk with your health care provider to understand your risk for colorectal cancer and to make a screening plan that is right for you. Questions to ask your health care provider  When should I start colorectal cancer screening?  What is my risk for colorectal cancer?  How often do I need screening?  Which screening tests do I need?  How do I get my test results?  What do my results mean? Where to find more information Learn more about colorectal cancer screening from:  The American Cancer Society: www.cancer.org  The National Cancer Institute: www.cancer.gov Summary  Colorectal cancer screening is a group of tests used to check for colorectal cancer before symptoms develop.  Screening reduces your risk for colorectal cancer and can help identify cancer at an early stage, when the cancer can be removed or treated more easily.  All adults starting at age 50 until age 75 should have screening. Your health care provider may recommend screening at age 45.  You may have screening tests starting at an earlier age, or more frequently than other people, if you have certain risk factors.  Talk with your health care provider to understand your risk for colorectal cancer and to make a screening plan that is right for you. This information is not intended to replace advice given to you by your health care provider. Make sure you discuss any questions you have with your health care  provider. Document Revised: 06/19/2018 Document Reviewed: 11/29/2016 Elsevier Patient Education  2020 Elsevier Inc.  

## 2019-05-07 LAB — COMPREHENSIVE METABOLIC PANEL
ALT: 22 IU/L (ref 0–44)
AST: 21 IU/L (ref 0–40)
Albumin/Globulin Ratio: 2 (ref 1.2–2.2)
Albumin: 4.8 g/dL (ref 3.8–4.9)
Alkaline Phosphatase: 88 IU/L (ref 39–117)
BUN/Creatinine Ratio: 16 (ref 9–20)
BUN: 15 mg/dL (ref 6–24)
Bilirubin Total: 0.8 mg/dL (ref 0.0–1.2)
CO2: 21 mmol/L (ref 20–29)
Calcium: 9.9 mg/dL (ref 8.7–10.2)
Chloride: 102 mmol/L (ref 96–106)
Creatinine, Ser: 0.91 mg/dL (ref 0.76–1.27)
GFR calc Af Amer: 108 mL/min/{1.73_m2} (ref >59)
GFR calc non Af Amer: 93 mL/min/{1.73_m2} (ref >59)
Globulin, Total: 2.4 g/dL (ref 1.5–4.5)
Glucose: 105 mg/dL — ABNORMAL HIGH (ref 65–99)
Potassium: 4 mmol/L (ref 3.5–5.2)
Sodium: 139 mmol/L (ref 134–144)
Total Protein: 7.2 g/dL (ref 6.0–8.5)

## 2019-05-07 LAB — HEMOGLOBIN A1C
Est. average glucose Bld gHb Est-mCnc: 117 mg/dL
Hgb A1c MFr Bld: 5.7 % — ABNORMAL HIGH (ref 4.8–5.6)

## 2019-05-07 LAB — LIPID PANEL
Chol/HDL Ratio: 4.6 ratio (ref 0.0–5.0)
Cholesterol, Total: 193 mg/dL (ref 100–199)
HDL: 42 mg/dL (ref >39)
LDL Chol Calc (NIH): 134 mg/dL — ABNORMAL HIGH (ref 0–99)
Triglycerides: 91 mg/dL (ref 0–149)
VLDL Cholesterol Cal: 17 mg/dL (ref 5–40)

## 2019-05-09 NOTE — Progress Notes (Signed)
Called pt no answer left VM.

## 2019-05-09 NOTE — Progress Notes (Signed)
Lm for lab result

## 2019-06-02 ENCOUNTER — Ambulatory Visit: Payer: Self-pay | Attending: Internal Medicine

## 2019-06-02 DIAGNOSIS — Z23 Encounter for immunization: Secondary | ICD-10-CM

## 2019-06-02 NOTE — Progress Notes (Signed)
   Covid-19 Vaccination Clinic  Name:  Dravyn Severs    MRN: [id number] DOB: May 12, 1960  06/02/2019  Mr. Hinks was observed post Covid-19 immunization for 15 minutes without incident. He was provided with Vaccine Information Sheet and instruction to access the V-Safe system.   Mr. Zeien was instructed to call 911 with any severe reactions post vaccine: Marland Kitchen Difficulty breathing  . Swelling of face and throat  . A fast heartbeat  . A bad rash all over body  . Dizziness and weakness   Immunizations Administered    Name Date Dose VIS Date Route   Pfizer COVID-19 Vaccine 06/02/2019 10:28 AM 0.3 mL 02/21/2019 Intramuscular   Manufacturer: [employer], [employer]   Lot: [id number]   NDC: [address]-3

## 2019-06-25 ENCOUNTER — Ambulatory Visit: Payer: Self-pay | Attending: Internal Medicine

## 2019-06-25 DIAGNOSIS — Z23 Encounter for immunization: Secondary | ICD-10-CM

## 2019-06-25 NOTE — Progress Notes (Signed)
   Covid-19 Vaccination Clinic  Name:  Amaad Byers    MRN: [id number] DOB: 08/21/1960  06/25/2019  Mr. Flax was observed post Covid-19 immunization for 15 minutes without incident. He was provided with Vaccine Information Sheet and instruction to access the V-Safe system.   Mr. Fazzino was instructed to call 911 with any severe reactions post vaccine: Marland Kitchen Difficulty breathing  . Swelling of face and throat  . A fast heartbeat  . A bad rash all over body  . Dizziness and weakness   Immunizations Administered    Name Date Dose VIS Date Route   Pfizer COVID-19 Vaccine 06/25/2019 10:37 AM 0.3 mL 02/21/2019 Intramuscular   Manufacturer: [employer], [employer]   Lot: [id number]   NDC: [address]-4
